# Patient Record
Sex: Female | Born: 1935 | Race: White | Hispanic: No | Marital: Married | Smoking: Former smoker
Health system: Southern US, Community
[De-identification: ages and names within clinical notes are randomized; demographics above are authoritative.]

## PROBLEM LIST (undated history)

## (undated) DIAGNOSIS — F329 Major depressive disorder, single episode, unspecified: Secondary | ICD-10-CM

## (undated) DIAGNOSIS — E079 Disorder of thyroid, unspecified: Secondary | ICD-10-CM

## (undated) DIAGNOSIS — F419 Anxiety disorder, unspecified: Secondary | ICD-10-CM

## (undated) DIAGNOSIS — F32A Depression, unspecified: Secondary | ICD-10-CM

## (undated) DIAGNOSIS — I10 Essential (primary) hypertension: Secondary | ICD-10-CM

## (undated) DIAGNOSIS — E785 Hyperlipidemia, unspecified: Secondary | ICD-10-CM

## (undated) HISTORY — DX: Essential (primary) hypertension: I10

## (undated) HISTORY — DX: Hyperlipidemia, unspecified: E78.5

## (undated) HISTORY — DX: Disorder of thyroid, unspecified: E07.9

## (undated) HISTORY — DX: Major depressive disorder, single episode, unspecified: F32.9

## (undated) HISTORY — PX: VAGINAL HYSTERECTOMY: SUR661

## (undated) HISTORY — DX: Anxiety disorder, unspecified: F41.9

## (undated) HISTORY — PX: TONSILLECTOMY: SHX5217

## (undated) HISTORY — DX: Depression, unspecified: F32.A

---

## 2004-09-27 ENCOUNTER — Ambulatory Visit: Payer: Self-pay | Admitting: Internal Medicine

## 2004-10-12 ENCOUNTER — Ambulatory Visit: Payer: Self-pay | Admitting: Internal Medicine

## 2005-10-07 ENCOUNTER — Ambulatory Visit: Payer: Self-pay | Admitting: Internal Medicine

## 2006-02-14 ENCOUNTER — Ambulatory Visit: Payer: Self-pay | Admitting: Internal Medicine

## 2006-02-15 ENCOUNTER — Ambulatory Visit: Payer: Self-pay | Admitting: Internal Medicine

## 2006-02-18 ENCOUNTER — Emergency Department: Payer: Self-pay | Admitting: Emergency Medicine

## 2006-02-18 ENCOUNTER — Other Ambulatory Visit: Payer: Self-pay

## 2006-02-18 ENCOUNTER — Ambulatory Visit: Payer: Self-pay | Admitting: Internal Medicine

## 2006-08-31 ENCOUNTER — Ambulatory Visit: Payer: Self-pay | Admitting: Internal Medicine

## 2006-09-13 ENCOUNTER — Ambulatory Visit: Payer: Self-pay | Admitting: Internal Medicine

## 2006-10-09 ENCOUNTER — Ambulatory Visit: Payer: Self-pay | Admitting: Internal Medicine

## 2006-10-10 ENCOUNTER — Ambulatory Visit: Payer: Self-pay | Admitting: Internal Medicine

## 2007-09-18 ENCOUNTER — Ambulatory Visit: Payer: Self-pay | Admitting: Internal Medicine

## 2007-10-12 ENCOUNTER — Ambulatory Visit: Payer: Self-pay | Admitting: Internal Medicine

## 2008-10-15 ENCOUNTER — Ambulatory Visit: Payer: Self-pay | Admitting: Internal Medicine

## 2008-10-23 ENCOUNTER — Ambulatory Visit: Payer: Self-pay | Admitting: Internal Medicine

## 2008-12-17 ENCOUNTER — Ambulatory Visit: Payer: Self-pay | Admitting: Internal Medicine

## 2009-01-21 ENCOUNTER — Ambulatory Visit: Payer: Self-pay | Admitting: Surgery

## 2009-04-29 ENCOUNTER — Ambulatory Visit: Payer: Self-pay | Admitting: Surgery

## 2009-04-30 ENCOUNTER — Ambulatory Visit: Payer: Self-pay | Admitting: Internal Medicine

## 2009-10-06 ENCOUNTER — Ambulatory Visit: Payer: Self-pay | Admitting: Gastroenterology

## 2009-10-09 LAB — PATHOLOGY REPORT

## 2009-10-29 ENCOUNTER — Ambulatory Visit: Payer: Self-pay | Admitting: Internal Medicine

## 2010-04-19 IMAGING — US ULTRASOUND LEFT BREAST
1 series · 17 of 18 positions shown · non-contrast
Comparison: none

REASON FOR EXAM: left breast parenchymal density
COMMENTS:

[Series 1: ultrasound left breast · 17 of 18 slices shown]
[im 1/18]
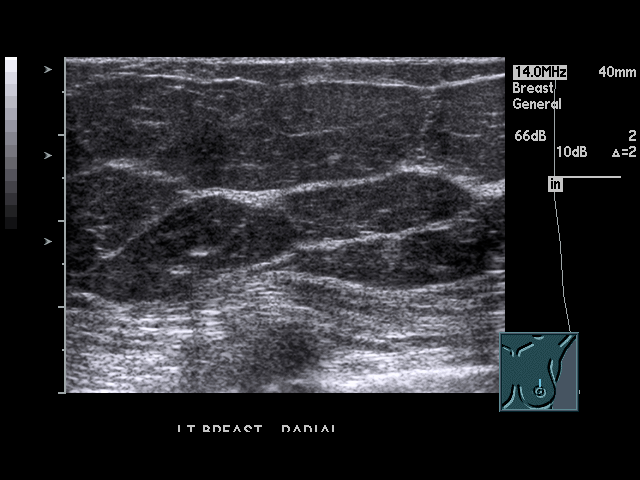
[im 2/18]
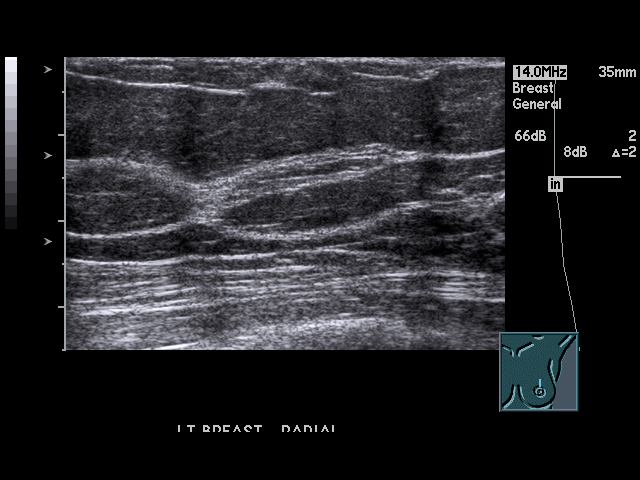
[im 3/18]
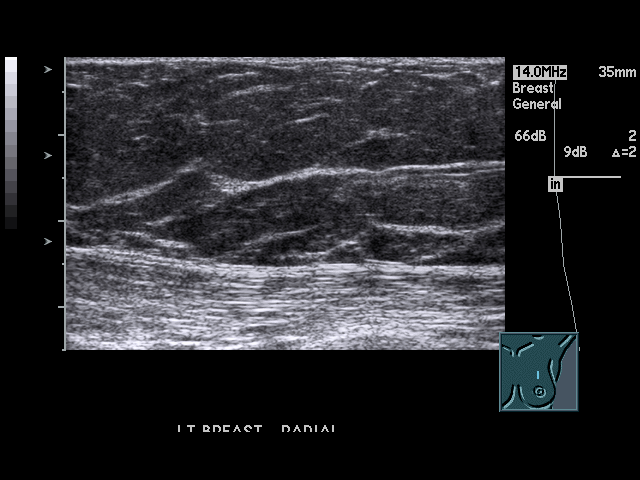
[im 4/18]
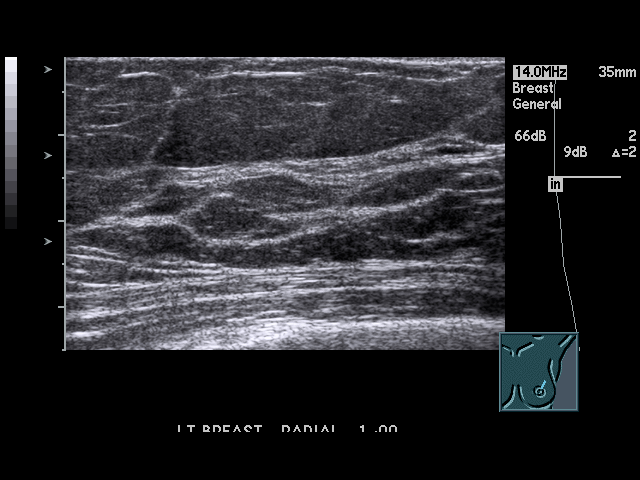
[im 5/18]
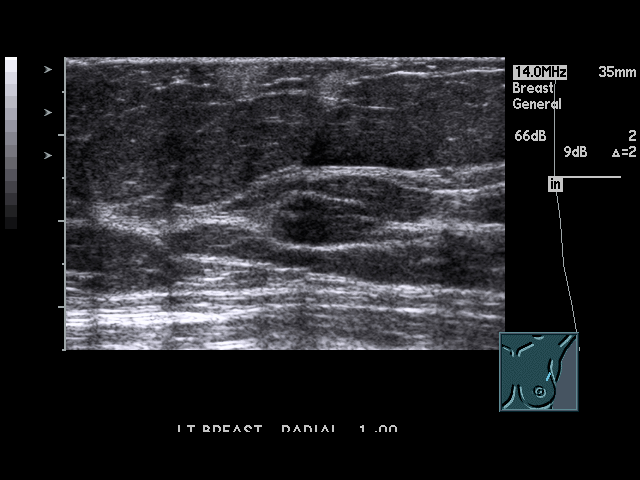
[im 6/18]
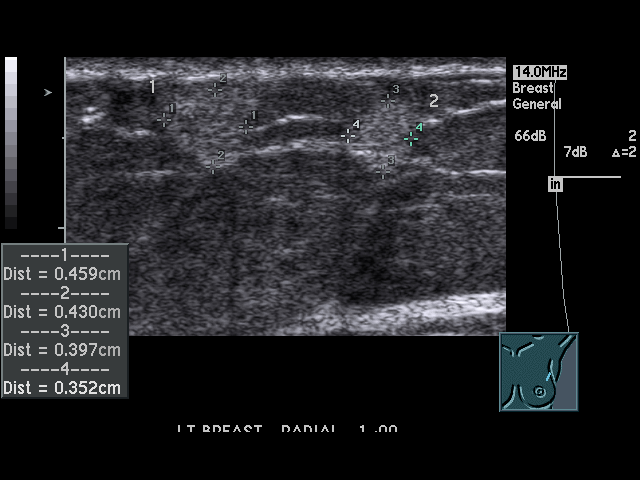
[im 7/18]
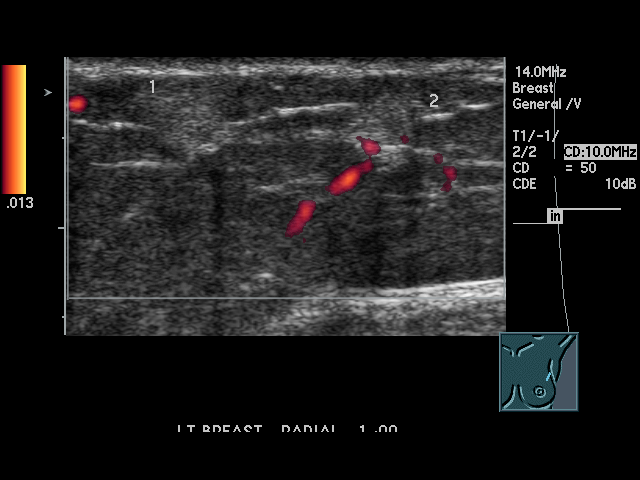
[im 8/18]
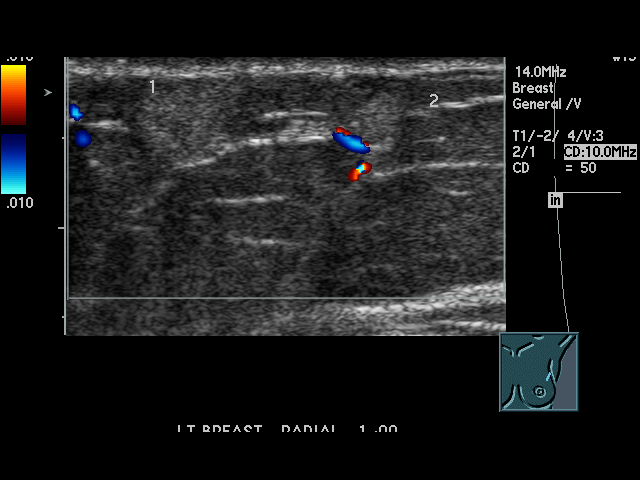
[im 10/18]
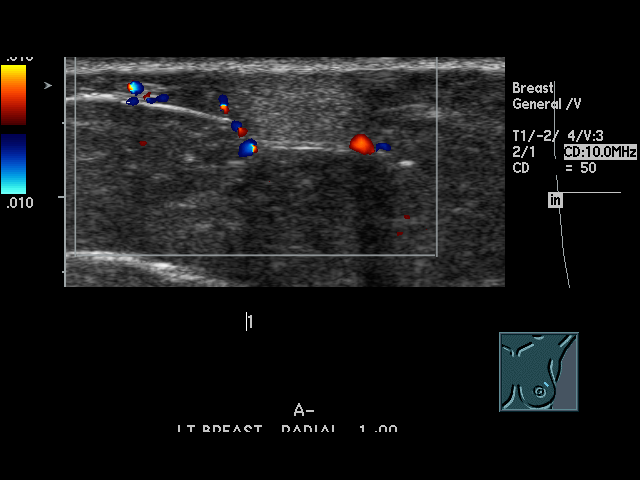
[im 11/18]
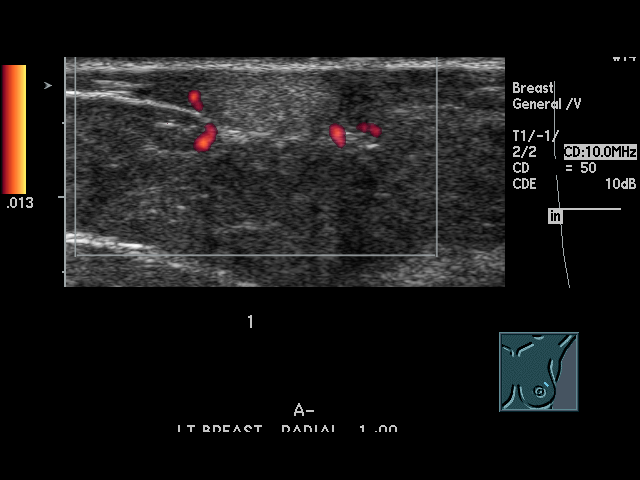
[im 12/18]
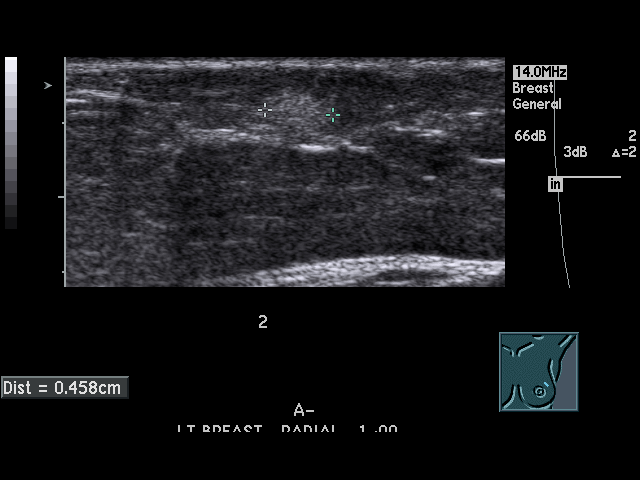
[im 13/18]
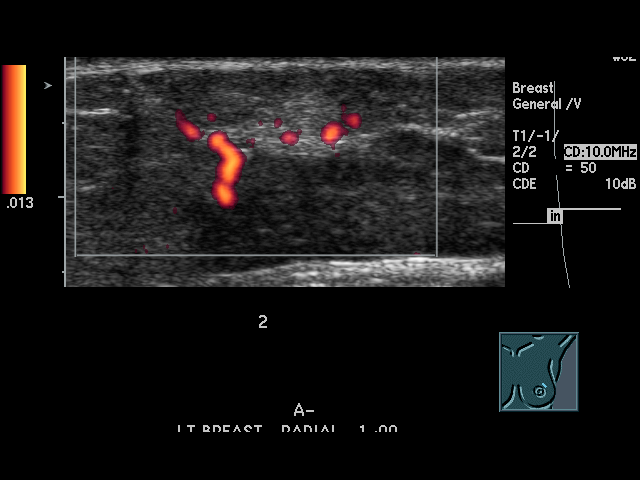
[im 14/18]
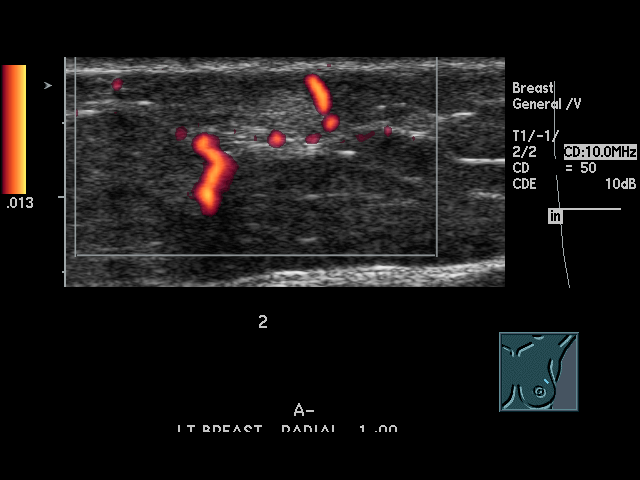
[im 15/18]
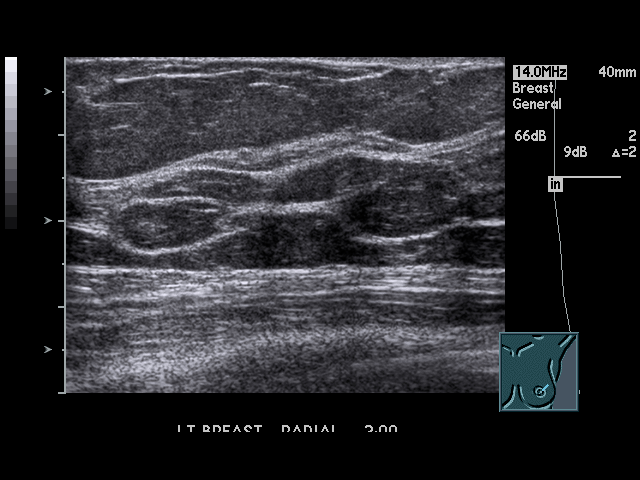
[im 16/18]
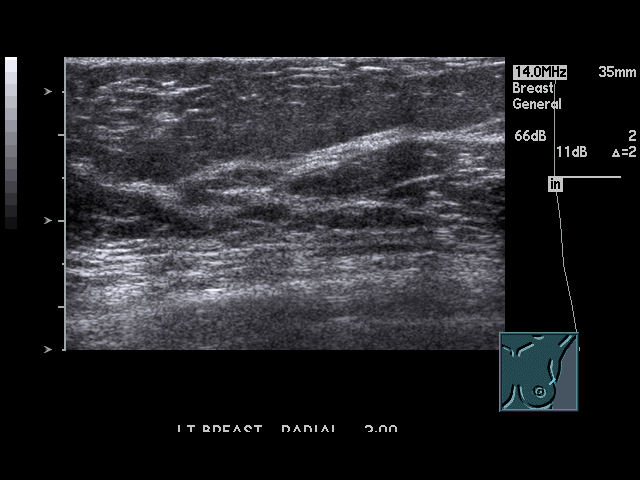
[im 17/18]
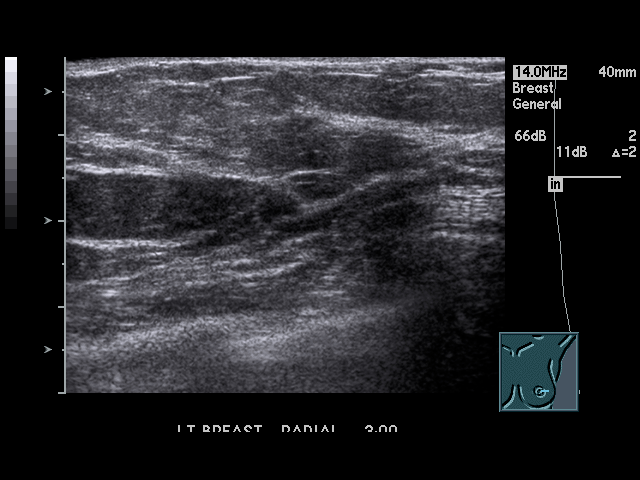
[im 18/18]
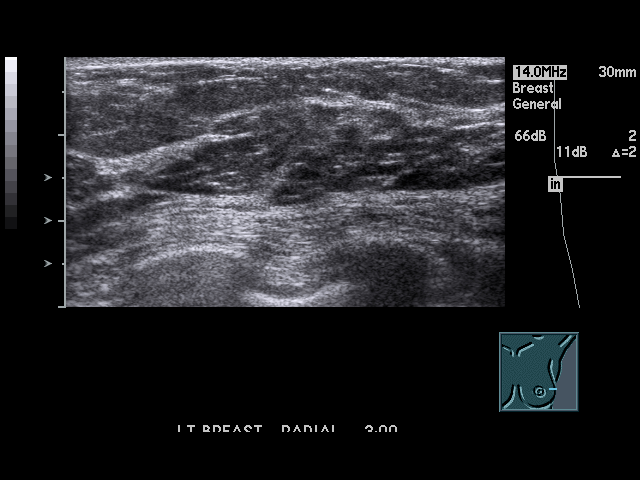

[17 of 18 positions shown; findings below may reference images not displayed]

PROCEDURE:     US  - US BREAST LEFT  - October 23, 2008  [DATE]

RESULT:

RESULT:     Additional views of the left breast reveal no focal underlying
abnormality. Ultrasound was performed for confirmation. Ultrasound reveals
two, well-circumscribed, hyperechoic, less than 1 cm, small lesions
consistent with tiny lipomas. A repeat Left Breast Ultrasound can be
performed in six months as can repeat mammogram to ensure stability.
IMPRESSION: BI-RADS: Category 3 - Probably Benign Finding (interval follow-up).

## 2010-11-30 ENCOUNTER — Ambulatory Visit: Payer: Self-pay | Admitting: Internal Medicine

## 2011-02-25 ENCOUNTER — Ambulatory Visit: Payer: Self-pay | Admitting: Internal Medicine

## 2012-02-28 ENCOUNTER — Ambulatory Visit: Payer: Self-pay | Admitting: Internal Medicine

## 2012-09-25 ENCOUNTER — Ambulatory Visit: Payer: Self-pay | Admitting: Internal Medicine

## 2013-01-28 ENCOUNTER — Ambulatory Visit: Payer: Self-pay | Admitting: Internal Medicine

## 2013-02-21 ENCOUNTER — Ambulatory Visit: Payer: Self-pay | Admitting: Gastroenterology

## 2013-02-28 ENCOUNTER — Ambulatory Visit: Payer: Self-pay | Admitting: Internal Medicine

## 2013-04-02 ENCOUNTER — Ambulatory Visit: Payer: Self-pay | Admitting: Gastroenterology

## 2013-04-04 LAB — PATHOLOGY REPORT

## 2013-04-10 ENCOUNTER — Ambulatory Visit: Payer: Self-pay | Admitting: Gastroenterology

## 2013-04-23 ENCOUNTER — Ambulatory Visit: Payer: Self-pay | Admitting: Gastroenterology

## 2013-07-06 DIAGNOSIS — IMO0001 Reserved for inherently not codable concepts without codable children: Secondary | ICD-10-CM | POA: Insufficient documentation

## 2013-08-27 DIAGNOSIS — E785 Hyperlipidemia, unspecified: Secondary | ICD-10-CM | POA: Insufficient documentation

## 2013-08-27 DIAGNOSIS — M9979 Connective tissue and disc stenosis of intervertebral foramina of abdomen and other regions: Secondary | ICD-10-CM | POA: Insufficient documentation

## 2013-08-27 DIAGNOSIS — F32A Depression, unspecified: Secondary | ICD-10-CM | POA: Insufficient documentation

## 2013-08-27 DIAGNOSIS — I10 Essential (primary) hypertension: Secondary | ICD-10-CM | POA: Insufficient documentation

## 2013-08-27 DIAGNOSIS — F419 Anxiety disorder, unspecified: Secondary | ICD-10-CM

## 2013-08-27 DIAGNOSIS — R251 Tremor, unspecified: Secondary | ICD-10-CM | POA: Insufficient documentation

## 2013-08-27 DIAGNOSIS — F329 Major depressive disorder, single episode, unspecified: Secondary | ICD-10-CM | POA: Insufficient documentation

## 2013-08-27 DIAGNOSIS — G4700 Insomnia, unspecified: Secondary | ICD-10-CM | POA: Insufficient documentation

## 2013-08-27 DIAGNOSIS — E039 Hypothyroidism, unspecified: Secondary | ICD-10-CM | POA: Insufficient documentation

## 2013-09-18 DIAGNOSIS — H02409 Unspecified ptosis of unspecified eyelid: Secondary | ICD-10-CM | POA: Insufficient documentation

## 2013-09-18 DIAGNOSIS — H519 Unspecified disorder of binocular movement: Secondary | ICD-10-CM | POA: Insufficient documentation

## 2013-12-02 DIAGNOSIS — R5383 Other fatigue: Secondary | ICD-10-CM | POA: Insufficient documentation

## 2014-03-03 ENCOUNTER — Ambulatory Visit: Payer: Self-pay | Admitting: Internal Medicine

## 2014-09-04 ENCOUNTER — Ambulatory Visit (INDEPENDENT_AMBULATORY_CARE_PROVIDER_SITE_OTHER): Payer: 59 | Admitting: Psychiatry

## 2014-09-04 ENCOUNTER — Encounter: Payer: Self-pay | Admitting: Psychiatry

## 2014-09-04 VITALS — BP 177/103 | HR 65

## 2014-09-04 DIAGNOSIS — F329 Major depressive disorder, single episode, unspecified: Secondary | ICD-10-CM

## 2014-09-04 DIAGNOSIS — M810 Age-related osteoporosis without current pathological fracture: Secondary | ICD-10-CM | POA: Insufficient documentation

## 2014-09-04 DIAGNOSIS — M1990 Unspecified osteoarthritis, unspecified site: Secondary | ICD-10-CM | POA: Insufficient documentation

## 2014-09-04 DIAGNOSIS — F411 Generalized anxiety disorder: Secondary | ICD-10-CM

## 2014-09-04 DIAGNOSIS — E079 Disorder of thyroid, unspecified: Secondary | ICD-10-CM | POA: Insufficient documentation

## 2014-09-04 DIAGNOSIS — H409 Unspecified glaucoma: Secondary | ICD-10-CM | POA: Insufficient documentation

## 2014-09-04 DIAGNOSIS — C6930 Malignant neoplasm of unspecified choroid: Secondary | ICD-10-CM | POA: Insufficient documentation

## 2014-09-04 DIAGNOSIS — L409 Psoriasis, unspecified: Secondary | ICD-10-CM | POA: Insufficient documentation

## 2014-09-04 DIAGNOSIS — F32A Depression, unspecified: Secondary | ICD-10-CM

## 2014-09-04 MED ORDER — VENLAFAXINE HCL ER 150 MG PO CP24: 150.0000 mg | ORAL_CAPSULE | Freq: Every day | ORAL | Status: DC

## 2014-09-04 NOTE — Progress Notes (Signed)
BH MD/PA/NP OP Progress Note  09/04/2014 10:42 AM Sara Vaughn  MRN:  [id number]  Subjective:    Patient is a 81 year old female who presented for the follow-up appointment. She has history of depression and reported that she feels anxious most of the time. Her blood pressure was significantly elevated and it was checked twice during this interview. She reported that she feels apprehensive. She was pleasant and cooperative during the interview she does not have any source of significant stress. Patient reported that she has been compliant with her medication and has stopped the BuSpar and does not feel as sleepy as in the past. She currently denied having any suicidal ideations or plans. She reported that she has history of strokes in her family members and she became concerned about her high blood pressure during this visit she is planning to visit her primary care physician right now with her husband.   Chief Complaint:  Chief Complaint    Follow-up; Anxiety; Depression     Visit Diagnosis:     ICD-9-CM ICD-10-CM   1. Depression 311 F32.9   2. GAD (generalized anxiety disorder) 300.02 F41.1     Past Medical History: No past medical history on file. No past surgical history on file. Family History: No family history on file. Social History:  History   Social History  . Marital Status: Married    Spouse Name: N/A  . Number of Children: N/A  . Years of Education: N/A   Social History Main Topics  . Smoking status: Not on file  . Smokeless tobacco: Not on file  . Alcohol Use: Not on file  . Drug Use: Not on file  . Sexual Activity: Not on file   Other Topics Concern  . Not on file   Social History Narrative  . No narrative on file   Additional History:   Lives with husband, married x 53 years.   Assessment:   Musculoskeletal: Strength & Muscle Tone: within normal limits Gait & Station: normal Patient leans: N/A  Psychiatric Specialty Exam: HPI  Review of  Systems  Constitutional: Positive for malaise/fatigue. Negative for chills.  HENT: Negative for hearing loss and nosebleeds.   Eyes: Positive for double vision and pain.  Respiratory: Negative for stridor.   Cardiovascular: Negative for claudication.  Gastrointestinal: Negative for diarrhea.  Genitourinary: Negative for frequency.  Musculoskeletal: Negative for back pain.  Neurological: Positive for tremors. Negative for sensory change.  Endo/Heme/Allergies: Negative for environmental allergies.  Psychiatric/Behavioral: Negative for depression.    There were no vitals taken for this visit.There is no height or weight on file to calculate BMI.  General Appearance: Fairly Groomed  Eye Contact:  Fair  Speech:  Normal Rate  Volume:  Normal  Mood:  Anxious  Affect:  Congruent  Thought Process:  Coherent  Orientation:  Full (Time, Place, and Person)  Thought Content:  WDL  Suicidal Thoughts:  No  Homicidal Thoughts:  No  Memory:  Immediate;   Fair  Judgement:  Fair  Insight:  Fair  Psychomotor Activity:  Decreased  Concentration:  Fair  Recall:  [employer] of Knowledge: Fair  Language: Fair  Akathisia:  No  Handed:  Right  AIMS (if indicated):  none  Assets:  Communication Skills Desire for Improvement Housing  ADL's:  Intact  Cognition: WNL  Sleep:  7-8    Is the patient at risk to self?  No. Has the patient been a risk to self in the past 6  months?  No. Has the patient been a risk to self within the distant past?  No. Is the patient a risk to others?  No. Has the patient been a risk to others in the past 6 months?  No. Has the patient been a risk to others within the distant past?  No.  Current Medications: Current Outpatient Prescriptions  Medication Sig Dispense Refill  . busPIRone (BUSPAR) 5 MG tablet Take by mouth.    . hydrochlorothiazide (HYDRODIURIL) 25 MG tablet TAKE 1 TABLET EVERY DAY    . levothyroxine (SYNTHROID) 100 MCG tablet TAKE 1 TABLET EVERY DAY     . losartan (COZAAR) 50 MG tablet TAKE 1 TABLET EVERY DAY    . RABEprazole (ACIPHEX) 20 MG tablet TAKE 1 TABLET EVERY DAY USUALLY 30 MINUTES BEFORE BREAKFAST    . simvastatin (ZOCOR) 20 MG tablet TAKE 1 TABLET EVERY DAY USUALLY IN THE EVENING    . timolol (TIMOPTIC) 0.5 % ophthalmic solution     . venlafaxine XR (EFFEXOR-XR) 75 MG 24 hr capsule Take by mouth.    . fluticasone (FLONASE) 50 MCG/ACT nasal spray Place into the nose.    . latanoprost (XALATAN) 0.005 % ophthalmic solution Apply to eye.     No current facility-administered medications for this visit.    Medical Decision Making:  Established Problem, Stable/Improving (1), Review of Last Therapy Session (1) and Review of Medication Regimen & Side Effects (2)  Treatment Plan Summary:Medication management   Advised patient to go to her primary care physician from this office as her blood pressure is significantly elevated and it was checked twice. She agreed with the plan. She will continue on her venlafaxine as prescribed Patient will follow up in 1 month or earlier depending on her symptoms. She will also start following up with Miguel Dibble for her therapy sessions Advised patient to call if she notices worsening of her symptoms and she demonstrated understanding   More than 50% of the time spent in psychoeducation, counseling and coordination of care.    This note was generated in part or whole with voice recognition software. Voice regonition is usually quite accurate but there are transcription errors that can and very often do occur. I apologize for any typographical errors that were not detected and corrected.    Rainey Pines 09/04/2014, 10:42 AM

## 2014-09-11 ENCOUNTER — Ambulatory Visit (INDEPENDENT_AMBULATORY_CARE_PROVIDER_SITE_OTHER): Payer: 59 | Admitting: Licensed Clinical Social Worker

## 2014-09-11 DIAGNOSIS — F411 Generalized anxiety disorder: Secondary | ICD-10-CM | POA: Diagnosis not present

## 2014-09-11 DIAGNOSIS — F331 Major depressive disorder, recurrent, moderate: Secondary | ICD-10-CM

## 2014-09-11 NOTE — Progress Notes (Signed)
THERAPIST PROGRESS NOTE  Session Time: 1:15 p.m. - 2:20 p.m.  Participation Level: Active  Behavioral Response: Casual and Well GroomedAlertAnxious and Depressed  Type of Therapy: Individual Therapy  Treatment Goals addressed: Anxiety and Coping  Interventions: CBT, Solution Focused, Strength-based, Supportive and Reframing  Summary: PEGAH SEGEL is a 81 y.o. female who presents with primary diagnosis of Major Depression and additional symptoms of anxiety and who also sees Dr. Gretel Acre in this clinic for medication management.  Mrs. Henslee has not been to see LCSW for several months.  She recently had a visit from her daughter who was down from [place].  "She came down to do something with mom."  Client proudly showed off a new outfit that her daughter bought her during recent visit.  She admitted that she did not have the interest or energy to go with her daughter and therefore the daughter choose the outfits for her.  "I've gained so much weight that I can't zip my pants."  Her adult children have been visiting and calling and sending cards and this has lifted up her mood.  Her husband, Ronalee Belts, is supportive and wanting to take her [facility] for additional medical testing.  He has been more helpful around the home per client.  She spoke briefly of various challenges over the years due to spouse's own mental health symptoms/behaviors.    She admits that she has been sad and that this feeling goes back several years even when she was working.  The symptoms that she voiced having primary concern about are around her loss of energy and interests.  There is a sense of "why bother" doing anything because she lacks the energy to participate in most things.  Madesyn talked about various life changes and decisions that she and spouse and their adult children have been speaking about.  These include putting their beach home on the market to be sold, spouse telling one of the daughters who has been financially  dependent on them for years that she was no longer going to receive money after a certain month this year and conversations around selling their home and moving to [place] to be near their son, his wife and their grand-children.  "I love my house and don't want to leave it.  It is more than we need."  There is some hesitancy on client's part to move to [place], not because of her relationship with family, rather concerns that her spouse will try to "take over" given that he likes to be in charge of things per client.  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  "My card group, they're all dying. I've been with them for 45 years."  She spoke somewhat tangentially yet no evidence of confusion, paranoia or disorientation.  Some disappointment voiced in terms of what she is not getting from her PCP which she informed LCSW as "I need him to tell me what to do. To give me some direction about what I should be doing with myself."  She reported less fatigue since stopping anti-anxiety medication.  Client would like to consider what are realistic goals for her at this stage of her life. 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  "I was always prepared."  She was receptive to information and suggestions provided by LCSW during session today and agreed  to read information for discussion at next session.  Alizia with voiced interest in mindfulness.  Suicidal/Homicidal: Negativewithout intent/plan  Therapist Response:   Assist in developing awareness of cognitive messages that reinforce hopelessness and helplessness and assist in self recognition of healthier cognitive messages that enhance self-confidence and improve coping strategies.  LCSW provided client with supportive therapy with insight along with emotional and social support to reinforce  client's strengths and available resources.  Discussed possibility that client is experiencing what is referred to as a Phase of Life problem.  Offered her information on Cindee Salt Erickson's developmental theory on integrity versus despair.  Also provided her with hand-outs on  Understanding and coping with anxiety and the title of a book about Meditation and Mindfulness.  Offered emotional support and validation of current struggles while reassuring that change is possible.  Additional information provided on increasing motivation when depressed.  Plan: Return again in 1-2 weeks.  LCSW will explore and process with client more specific therapeutic goals to assist her to improve symptoms of anxiety and depression.  Diagnosis: Major Depressive Disorder, Recurrent, Moderate   Generalized Anxiety Disorder   Phase of Life Problem   Miguel Dibble, LCSW 09/11/2014

## 2014-09-12 DIAGNOSIS — F331 Major depressive disorder, recurrent, moderate: Secondary | ICD-10-CM | POA: Insufficient documentation

## 2014-09-12 DIAGNOSIS — F411 Generalized anxiety disorder: Secondary | ICD-10-CM | POA: Insufficient documentation

## 2014-10-02 ENCOUNTER — Ambulatory Visit (INDEPENDENT_AMBULATORY_CARE_PROVIDER_SITE_OTHER): Payer: 59 | Admitting: Licensed Clinical Social Worker

## 2014-10-02 DIAGNOSIS — F330 Major depressive disorder, recurrent, mild: Secondary | ICD-10-CM | POA: Diagnosis not present

## 2014-10-02 DIAGNOSIS — F411 Generalized anxiety disorder: Secondary | ICD-10-CM

## 2014-10-02 NOTE — Progress Notes (Signed)
THERAPIST PROGRESS NOTE  Session Time: 1:15 p.m. - 2:18 p.m.  Participation Level: Active  Behavioral Response: Well GroomedAlertAnxious  Type of Therapy: Individual Therapy  Treatment Goals addressed: Coping  Interventions: CBT, Strength-based, Supportive and Reframing  Summary: Sara Vaughn is a 81 y.o. female who presents with ongoing symptoms of anxiety which appear to be relatively life long from her report and depressive symptoms that appear to be improving as evidenced by her statement "I'm feeling more hopeful."  In addition, Sara Vaughn reports  "I'm not as groggy." Client showed LCSW a big bruise and knot on her left upper arm.  She fell after losing her balance in her bathroom.  Recently enjoyed a trip to their beach house on the 4th of July with her husband, son & two grandsons.  Reasons for dread around how spouse's driving scared.  "I don't feel like it's depression. I think it's something physical with me."  "I'm feeling like I'm old but I'm not but I'm moving like it." "Yesterday I was feeling really good." She has been more social and stated "It's because I feel like doing more. I feel like I've been in a better mood. There isn't the fatigue."    Discussed that in some ways she believes that her moods are better because her spouse, Sara Vaughn, is stable on his medications for Bi-Polar.  Valla was able to identify a connection between her moods and outlook and her spouses.  She talked more about family of origin issues during this session, specifically that her father was loud and would yell and having husband do the same over the years appears to have kept client very anxious.  She voiced interest in finding ways to be more calm and with the ability to recognize that through challenging certain patterns of thinking and reacting, that she can be less anxious.  Faron continues to be receptive to the therapeutic process and has fair to good insight about what role the  information discussed during session can have on her overall quality of life and goal of being more calm.  She voiced understanding of the physiological and emotional/behavioral connections of stress, worry and chronic anxiety.  "That's what my daughter in law told me. That I had just run myself down."  Goal:  "To be calm."  Suicidal/Homicidal: Negativewithout intent/plan  Therapist Response:  Ongoing active and reflective listening to build and maintain trust and rapport with client.  Provided psycho-education around impacts of prolonged anxiety and the physiology of fear, real and imagined.  Provided additional hand-outs on examples of irrational and rational beliefs and strategies for identifying and re-framing negative and irrational thoughts.  LCSW provided client with supportive therapy with insight along with emotional and social support to reinforce client's strengths and available resources.  Offered psycho-education on additional factors at work in depressive and anxious symptoms including feeling ill prepared to deal with high conflict individuals, co-dependency and passivity with lack of boundaries.  Reassured client that she can reach personal goal through learning of key concepts/theories and application.  Encouraged to read all hand-outs for discussion at next session and welcomed calls between sessions PRN.  Plan: Return again in 3 weeks.  Sara Vaughn will read information provided on cognitive distortions and the CBT model as well as about being the care-giver for someone with Bi-Polar and hand-out about co-dependency features and how to improve self-care and boundary setting.  Diagnosis: Major Depressive Disorder, Recurrent, Mild    PHQ9 score today: 6, Somewhat difficult  Generalized Anxiety Disorder   Phase of Life Problem   Sara Dibble, LCSW 10/02/2014

## 2014-10-13 ENCOUNTER — Ambulatory Visit (INDEPENDENT_AMBULATORY_CARE_PROVIDER_SITE_OTHER): Payer: 59 | Admitting: Psychiatry

## 2014-10-13 ENCOUNTER — Encounter: Payer: Self-pay | Admitting: Psychiatry

## 2014-10-13 VITALS — BP 124/82 | HR 70 | Temp 97.8°F | Ht 62.0 in | Wt 163.2 lb

## 2014-10-13 DIAGNOSIS — Z85828 Personal history of other malignant neoplasm of skin: Secondary | ICD-10-CM | POA: Insufficient documentation

## 2014-10-13 DIAGNOSIS — Z8679 Personal history of other diseases of the circulatory system: Secondary | ICD-10-CM | POA: Insufficient documentation

## 2014-10-13 DIAGNOSIS — F411 Generalized anxiety disorder: Secondary | ICD-10-CM | POA: Insufficient documentation

## 2014-10-13 DIAGNOSIS — Z8639 Personal history of other endocrine, nutritional and metabolic disease: Secondary | ICD-10-CM | POA: Insufficient documentation

## 2014-10-13 DIAGNOSIS — F331 Major depressive disorder, recurrent, moderate: Secondary | ICD-10-CM

## 2014-10-13 DIAGNOSIS — Z8719 Personal history of other diseases of the digestive system: Secondary | ICD-10-CM | POA: Insufficient documentation

## 2014-10-13 DIAGNOSIS — Z8709 Personal history of other diseases of the respiratory system: Secondary | ICD-10-CM | POA: Insufficient documentation

## 2014-10-13 MED ORDER — VENLAFAXINE HCL ER 150 MG PO CP24: 150.0000 mg | ORAL_CAPSULE | Freq: Every day | ORAL | Status: DC

## 2014-10-13 NOTE — Progress Notes (Signed)
BH MD/PA/NP OP Progress Note  10/13/2014 2:43 PM Sara Vaughn  MRN:  [id number]  Subjective:   Patient is a 81 year old female who presented for the follow-up appointment. She reported that she has been stable on her medications. She reported that she fell approximately a month ago as she tripped on the dresser and has been feeling better. She has been taking Effexor  in the morning and has stopped taking the BuSpar. She has been sleeping well. She appeared pleasant and cooperative during the interview and was talking about the world events. She denied having any suicidal homicidal ideations or plans. Chief Complaint:  Chief Complaint    Follow-up; Anxiety; Depression; Medication Refill     Visit Diagnosis:  No diagnosis found.  Past Medical History:  Past Medical History  Diagnosis Date  . Anxiety   . Thyroid disease   . Depression   . Hypertension   . Hyperlipidemia     Past Surgical History  Procedure Laterality Date  . Vaginal hysterectomy    . Tonsillectomy     Family History:  Family History  Problem Relation Age of Onset  . Alcohol abuse Father   . Anxiety disorder Father   . Hypertension Mother   . Hyperlipidemia Mother   . Heart attack Mother   . Transient ischemic attack Mother   . Hypertension Brother   . Diabetes Brother    Social History:  History   Social History  . Marital Status: Married    Spouse Name: N/A  . Number of Children: N/A  . Years of Education: N/A   Social History Main Topics  . Smoking status: Former Smoker    Types: Cigarettes    Quit date: 10/13/1974  . Smokeless tobacco: Never Used  . Alcohol Use: No  . Drug Use: No  . Sexual Activity: No   Other Topics Concern  . None   Social History Narrative   Additional History:  Lives with husband and he has been very attentive to her. She stated that she spends time in a very organized fashion.   Assessment:   Musculoskeletal: Strength & Muscle Tone: within normal  limits Gait & Station: normal Patient leans: N/A  Psychiatric Specialty Exam: HPI  Review of Systems  Cardiovascular: Positive for claudication.  Psychiatric/Behavioral: Positive for depression. The patient is nervous/anxious. The patient does not have insomnia.   All other systems reviewed and are negative.   Blood pressure 124/82, pulse 70, temperature 97.8 F (36.6 C), temperature source Tympanic, height 5\' 2"  (1.575 m), weight 163 lb 3.2 oz (74.027 kg), SpO2 94 %.Body mass index is 29.84 kg/(m^2).  General Appearance: Casual  Eye Contact:  Fair  Speech:  Clear and Coherent  Volume:  Normal  Mood:  Anxious and Depressed  Affect:  Congruent  Thought Process:  Coherent  Orientation:  Full (Time, Place, and Person)  Thought Content:  WDL  Suicidal Thoughts:  No  Homicidal Thoughts:  No  Memory:  Immediate;   Fair  Judgement:  Fair  Insight:  Fair  Psychomotor Activity:  Normal  Concentration:  Fair  Recall:  [employer] of Knowledge: Fair  Language: Fair  Akathisia:  No  Handed:  Right  AIMS (if indicated):  None   Assets:  Communication Skills Desire for Improvement Social Support  ADL's:  Intact  Cognition: WNL  Sleep:     Is the patient at risk to self?  No. Has the patient been a risk to self in the  past 6 months?  No. Has the patient been a risk to self within the distant past?  No. Is the patient a risk to others?  No. Has the patient been a risk to others in the past 6 months?  No. Has the patient been a risk to others within the distant past?  No.  Current Medications: Current Outpatient Prescriptions  Medication Sig Dispense Refill  . fluticasone (FLONASE) 50 MCG/ACT nasal spray Place into the nose.    . hydrochlorothiazide (HYDRODIURIL) 25 MG tablet TAKE 1 TABLET EVERY DAY    . latanoprost (XALATAN) 0.005 % ophthalmic solution Apply to eye.    . levothyroxine (SYNTHROID) 100 MCG tablet TAKE 1 TABLET EVERY DAY    . losartan (COZAAR) 50 MG tablet TAKE 1  TABLET EVERY DAY    . RABEprazole (ACIPHEX) 20 MG tablet TAKE 1 TABLET EVERY DAY USUALLY 30 MINUTES BEFORE BREAKFAST    . simvastatin (ZOCOR) 20 MG tablet TAKE 1 TABLET EVERY DAY USUALLY IN THE EVENING    . timolol (TIMOPTIC) 0.5 % ophthalmic solution     . venlafaxine XR (EFFEXOR-XR) 150 MG 24 hr capsule Take 1 capsule (150 mg total) by mouth daily with breakfast. 30 capsule 2  . busPIRone (BUSPAR) 5 MG tablet Take by mouth.     No current facility-administered medications for this visit.    Medical Decision Making:  Established Problem, Stable/Improving (1) and Review of Psycho-Social Stressors (1)  Treatment Plan Summary:Medication management   Patient will continue on Effexor XR 150 mg by mouth daily. Medication refill for the next 2 months and she will follow-up in 6 weeks or earlier depending on her symptoms.   More than 50% of the time spent in psychoeducation, counseling and coordination of care.    This note was generated in part or whole with voice recognition software. Voice regonition is usually quite accurate but there are transcription errors that can and very often do occur. I apologize for any typographical errors that were not detected and corrected.      Rainey Pines 10/13/2014, 2:43 PM

## 2014-10-23 ENCOUNTER — Ambulatory Visit (INDEPENDENT_AMBULATORY_CARE_PROVIDER_SITE_OTHER): Payer: 59 | Admitting: Licensed Clinical Social Worker

## 2014-10-23 DIAGNOSIS — F331 Major depressive disorder, recurrent, moderate: Secondary | ICD-10-CM

## 2014-10-23 DIAGNOSIS — F411 Generalized anxiety disorder: Secondary | ICD-10-CM

## 2014-10-23 NOTE — Progress Notes (Signed)
THERAPIST PROGRESS NOTE  Session Time:  2:25 p.m. - 3:20 p.m.  Participation Level: Active  Behavioral Response: Neat and Well GroomedAlertAnxious and Depressed  Type of Therapy: Individual Therapy  Treatment Goals addressed: Anxiety, Communication: assertiveness with family and Coping  Interventions: CBT, Solution Focused, Strength-based, Assertiveness Training, Supportive and Reframing  Summary: Sara Vaughn is a 81 y.o. female who presents with ongoing chronic worry and anxiety, depressive symptoms and low energy.  During session she identified that at this time she is likely worried about at least two specific situations. One concern is that she and spouse's oldest daughter, Sara Vaughn, will visit soon for a long week-end.  Their financial support to her is winding down since the daughter is looking to begin a new job that may likely help her towards payment of her student loans.  "As far as me, it's about my worrying about Sara Vaughn getting here and back home safely and Sara Vaughn and her husband who are going to [place] to do a spiritual walk."  Client expressed humor about adult children and grand-children just needing to not go anywhere so she knows where they are and how they are.  Sara Vaughn continues to be worried about her health especially her ongoing low energy.  "Is it because of my age? That's what Dr. Gretel Acre tells me." She thought she was feeling better in terms of her energy and improvement with symptoms of anxiety yet reort has returned over the past week.  "I don't know, maybe I'm worried about Sara Vaughn coming."  Other financial matters involving client and her husband assisting with private school tuition for at least one grand-child along with the daughter in [place] belief that they "should" help has been another ongoing source of stress.  She and LCSW explored historically her experience with chronic worrying and there is some belief per client that "I think because of how my dad was."   There were verbal outbursts of anger in the home and client experienced fear of her father as a result of these.  We also discussed how going from her often chaotic home environment to married life with a spouse with Bi-Polar may have continued to contribute to her excess worrying behaviors. Sara Vaughn denied present conflict with spouse and indicated to LCSW that She discussed how she has always felt that she accomplished a lot in her life and feeling uncertain about how to handle how she is feeling, especially her lack of interests in previously enjoyed social activities and going out.  Additional concern mentioned by client around "Am I being petty? Is this petty?"  This due to her belief that she does not really have problems that would require her to talk to a therapist.  Sara Vaughn did indicate "I really enjoy talking to you though and I seem to feel much better afterwards."  She voiced understanding and agreement with LCSW about the impact of her symptoms on her quality of life and seemed less critical of herself after this discussion.  She remains engaged and motivated to read educational materials provided along with application of the information to enhance coping.  Suicidal/Homicidal: Negativewithout intent/plan   Therapist Response:  Ongoing active and reflective listening to build and maintain trust and rapport with client.  Provided psycho-education around impacts of prolonged anxiety and chronic worry on the physiology of the brain and reviewed how CBT can assist to re-wire rigid and unhealthy thinking styles that contribute to an anxious brain.  Encouraged to continue her work on mindfulness and  also introduced her to the concepts of self-compassion and being aware of how self-criticism negatively affects how she may see herself in relation to how she handles stress and conflict.  Psycho-education on learned behaviors such as fear and hopelessness and helplessness. Encouraged to read hand-outs for  discussion at next session and welcomed calls between sessions PRN.  Gently reinforced that her issues and time in therapy is not petty as these are causing real distress for her.  Validated thoughts and feeling voiced and reinforced the importance of talking through negative emotions while also identifying positives in her life to achieve balance in her emotional well-being.  Plan: Return again in 3 weeks.  Sara Vaughn will read information provided on cognitive distortions and the CBT model as well as about being the care-giver for someone with Bi-Polar and hand-out about co-dependency features and how to improve self-care and boundary setting.  Diagnosis: Major Depressive Disorder, Recurrent, Mild    PHQ9 score today: 6, Somewhat difficult   Generalized Anxiety Disorder   Phase of Life Problem  Miguel Dibble, LCSW 10/23/2014

## 2014-10-24 DIAGNOSIS — F331 Major depressive disorder, recurrent, moderate: Secondary | ICD-10-CM | POA: Insufficient documentation

## 2014-11-13 ENCOUNTER — Ambulatory Visit (INDEPENDENT_AMBULATORY_CARE_PROVIDER_SITE_OTHER): Payer: 59 | Admitting: Licensed Clinical Social Worker

## 2014-11-14 ENCOUNTER — Ambulatory Visit (INDEPENDENT_AMBULATORY_CARE_PROVIDER_SITE_OTHER): Payer: 59 | Admitting: Licensed Clinical Social Worker

## 2014-11-14 DIAGNOSIS — F411 Generalized anxiety disorder: Secondary | ICD-10-CM | POA: Diagnosis not present

## 2014-11-14 DIAGNOSIS — F331 Major depressive disorder, recurrent, moderate: Secondary | ICD-10-CM

## 2014-11-14 NOTE — Progress Notes (Signed)
THERAPIST PROGRESS NOTE  Session Time:  2:10 p.m. -  3:20 p.m.  Participation Level: Active  Behavioral Response: CasualAlertAngry, Depressed and Euthymic  Type of Therapy: Individual Therapy  Treatment Goals addressed: Anxiety and Coping  Interventions: Solution Focused, Strength-based, Supportive and Family Systems  Summary: Sara Vaughn is a 81 y.o. female who presents with ongoing anxiety that she experiences as feeling easily over-whelmed by several things, phase of life transition (aging) and mild to moderate depressive symptoms.  She continues to maintain and apparently her adult children concur, that client is experiencing something medically wrong with her.  On a positive note and since discontinuing the Buspar, she no longer experiences as much fatigue. Client talked about ongoing concern about how little energy she has and that the energy usually increases later in the day. Progress reported per client: "My frame of thought is not as hopeless. I'm getting my humor back." Less dread about things. "Most people can't understand this but I like being with myself."  "I need to change my schedule but I don't want to go to bed earlier." Spouse, Sara Vaughn, falls asleep earlier than she does.  She talked about having very detailed dreams that are of a fantasy theme, using a computer and work related along with traveling and being with her daughter and daughter's friends when they were younger.  Recent dream was about a [place] and being in a scene where another language was being used. There is some belief that this is connected to her discontent with some of the church's teachings.  "Lately I'm losing my accuracy about things." She was referring to letting some things go with some things getting lost like bills. Voiced frustration at herself for this. "I'm having a lot of aches and pains that I haven't had before." "My anxiety is bothering me I've been trying to eliminate my  stressors."  Ongoing support from her husband, Sara Vaughn, and adult children was discussed and recent visit from daughter, Sara Vaughn, was pleasant with [name] joking "She has me on a new diet."  Daughter has introduced her to a new diet to focus on reducing inflammation that is believed to be causing her pain and fatigue.  "I like that you help me to break things down and think about different ways of looking at my situation. What you've said makes a lot of sense and is something that I can share with my family."  "I know that I'm at the end of my life and I'm at peace with this. I don't think my children are."  Sara Vaughn voiced belief that her adult children continue to have expectations or visions of "The iconic mother."  Denied dissatisfaction in previous activities and work and with good insight that she is at a phase in her life where she no longer needs to have a schedule that serves other people.  "I am okay with this." No additional concerns voiced.  Client would like to spend future sessions discussing anxiety and causes/triggers.  She agreed to re-read over previously provided hand-outs on anxiety and how to decrease this.  "By this time in my life I think it's so engrained in me."  Goal:  "To be calm."  Suicidal/Homicidal: Negativewithout intent/plan   Therapist Response:  Ongoing active and reflective listening to build and maintain trust and rapport with client.  Provided psycho-education around impacts of prolonged anxiety and the physiology of fear, real and imagined and offered client a hand-out that explains this process.  Offered supportive  therapy with insight around changing family roles and dynamics along with aging and stages of development and life transitions.  Normalized client's current thoughts and experiences. Encouraged to read all hand-outs for discussion at next session and welcomed calls between sessions PRN.  Encouraged ongoing expression of feelings, thoughts and needs. Encouraged her  to also mention her anxiety to Dr. Gretel Acre at her next appointment.  Plan: Return again in 2-3 weeks.  Client will take medications as prescribed and report any side effects or worsening symptoms to Dr. Gretel Acre or her medical provider.    Diagnosis: Major Depressive Disorder, Recurrent, Mild       Generalized Anxiety Disorder   Phase of Life Problem   Miguel Dibble, LCSW 11/14/2014

## 2014-11-25 ENCOUNTER — Ambulatory Visit: Payer: Self-pay | Admitting: Psychiatry

## 2014-11-28 ENCOUNTER — Ambulatory Visit: Payer: Self-pay | Admitting: Licensed Clinical Social Worker

## 2014-12-04 ENCOUNTER — Encounter: Payer: Self-pay | Admitting: Psychiatry

## 2014-12-04 ENCOUNTER — Ambulatory Visit (INDEPENDENT_AMBULATORY_CARE_PROVIDER_SITE_OTHER): Payer: 59 | Admitting: Psychiatry

## 2014-12-04 VITALS — BP 132/80 | HR 67 | Temp 97.2°F | Ht 62.0 in | Wt 161.8 lb

## 2014-12-04 DIAGNOSIS — F331 Major depressive disorder, recurrent, moderate: Secondary | ICD-10-CM | POA: Diagnosis not present

## 2014-12-04 MED ORDER — VENLAFAXINE HCL ER 150 MG PO CP24: 150.0000 mg | ORAL_CAPSULE | Freq: Every day | ORAL | Status: DC

## 2014-12-04 NOTE — Progress Notes (Signed)
[facility] MD/PA/NP OP Progress Note  12/04/2014 11:39 AM Sara Vaughn  MRN:  [id number]  Subjective:   Patient is a 81 year old married  female who presented for the follow-up appointment. She reported that she is feeling tired this morning as it is too early for her to come for the morning appointment. Patient reported that she usually wakes up around 9 AM. She reported that she did not have enough time to get ready. She is still feeling tired and she sleeps later in the night. She spends time by paying  her bills and reading books. She enjoys going to [employer]. Patient reported that she has been compliant with her medication and her daughter has been helping her with the pillbox. She takes venlafaxine in the morning. She appeared to have a small tremor in her mouth which is normal for her. She reported that her blood pressure is normal. She currently denied having any adverse effects of the medication. Reported that her memory is fine and she is able to drive to [employer] and around the neighborhood. She denied having any anxiety or paranoia.     Chief Complaint:  Chief Complaint    Follow-up; Medication Refill     Visit Diagnosis:  No diagnosis found.  Past Medical History:  Past Medical History  Diagnosis Date  . Anxiety   . Thyroid disease   . Depression   . Hypertension   . Hyperlipidemia     Past Surgical History  Procedure Laterality Date  . Vaginal hysterectomy    . Tonsillectomy     Family History:  Family History  Problem Relation Age of Onset  . Alcohol abuse Father   . Anxiety disorder Father   . Hypertension Mother   . Hyperlipidemia Mother   . Heart attack Mother   . Transient ischemic attack Mother   . Hypertension Brother   . Diabetes Brother    Social History:  Social History   Social History  . Marital Status: Married    Spouse Name: N/A  . Number of Children: N/A  . Years of Education: N/A   Social History Main Topics  . Smoking status:  Former Smoker    Types: Cigarettes    Quit date: 10/13/1974  . Smokeless tobacco: Never Used  . Alcohol Use: No  . Drug Use: No  . Sexual Activity: No   Other Topics Concern  . None   Social History Narrative   Additional History:  Lives with husband and he has been very attentive to her. She stated that she spends time in a very organized fashion.   Assessment:   Musculoskeletal: Strength & Muscle Tone: within normal limits Gait & Station: normal Patient leans: N/A  Psychiatric Specialty Exam: HPI   Review of Systems  Cardiovascular: Positive for claudication.  Psychiatric/Behavioral: The patient is nervous/anxious. The patient does not have insomnia.   All other systems reviewed and are negative.   Blood pressure 132/80, pulse 67, temperature 97.2 F (36.2 C), temperature source Tympanic, height 5\' 2"  (1.575 m), weight 161 lb 12.8 oz (73.392 kg), SpO2 95 %.Body mass index is 29.59 kg/(m^2).  General Appearance: Casual  Eye Contact:  Fair  Speech:  Clear and Coherent  Volume:  Normal  Mood:  Anxious  Affect:  Congruent  Thought Process:  Coherent  Orientation:  Full (Time, Place, and Person)  Thought Content:  WDL  Suicidal Thoughts:  No  Homicidal Thoughts:  No  Memory:  Immediate;   Fair  Judgement:  Fair  Insight:  Fair  Psychomotor Activity:  Normal  Concentration:  Fair  Recall:  [employer] of Knowledge: Fair  Language: Fair  Akathisia:  No  Handed:  Right  AIMS (if indicated):  None   Assets:  Communication Skills Desire for Improvement Social Support  ADL's:  Intact  Cognition: WNL  Sleep:     Is the patient at risk to self?  No. Has the patient been a risk to self in the past 6 months?  No. Has the patient been a risk to self within the distant past?  No. Is the patient a risk to others?  No. Has the patient been a risk to others in the past 6 months?  No. Has the patient been a risk to others within the distant past?  No.  Current  Medications: Current Outpatient Prescriptions  Medication Sig Dispense Refill  . fluticasone (FLONASE) 50 MCG/ACT nasal spray Place into the nose.    . hydrochlorothiazide (HYDRODIURIL) 25 MG tablet TAKE 1 TABLET EVERY DAY    . latanoprost (XALATAN) 0.005 % ophthalmic solution Apply to eye.    . levothyroxine (SYNTHROID) 100 MCG tablet TAKE 1 TABLET EVERY DAY    . losartan (COZAAR) 50 MG tablet TAKE 1 TABLET EVERY DAY    . RABEprazole (ACIPHEX) 20 MG tablet TAKE 1 TABLET EVERY DAY USUALLY 30 MINUTES BEFORE BREAKFAST    . simvastatin (ZOCOR) 20 MG tablet TAKE 1 TABLET EVERY DAY USUALLY IN THE EVENING    . timolol (TIMOPTIC) 0.5 % ophthalmic solution     . venlafaxine XR (EFFEXOR-XR) 150 MG 24 hr capsule Take 1 capsule (150 mg total) by mouth daily with breakfast. 30 capsule 2   No current facility-administered medications for this visit.    Medical Decision Making:  Established Problem, Stable/Improving (1) and Review of Psycho-Social Stressors (1)  Treatment Plan Summary:Medication management   Depression  Patient will continue on Effexor XR 150 mg by mouth daily. Medication refill for the next 2 months and she will follow-up in 2 months or earlier depending on her symptoms.   More than 50% of the time spent in psychoeducation, counseling and coordination of care. Time spent with the patient 25 minutes    This note was generated in part or whole with voice recognition software. Voice regonition is usually quite accurate but there are transcription errors that can and very often do occur. I apologize for any typographical errors that were not detected and corrected.      Rainey Pines 12/04/2014, 11:39 AM

## 2014-12-09 ENCOUNTER — Ambulatory Visit: Payer: Self-pay | Admitting: Licensed Clinical Social Worker

## 2014-12-15 ENCOUNTER — Ambulatory Visit (INDEPENDENT_AMBULATORY_CARE_PROVIDER_SITE_OTHER): Payer: 59 | Admitting: Licensed Clinical Social Worker

## 2014-12-15 DIAGNOSIS — F411 Generalized anxiety disorder: Secondary | ICD-10-CM

## 2014-12-15 DIAGNOSIS — F331 Major depressive disorder, recurrent, moderate: Secondary | ICD-10-CM
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
## 2015-01-29 ENCOUNTER — Ambulatory Visit (INDEPENDENT_AMBULATORY_CARE_PROVIDER_SITE_OTHER): Payer: 59 | Admitting: Psychiatry

## 2015-01-29 ENCOUNTER — Encounter: Payer: Self-pay | Admitting: Psychiatry

## 2015-01-29 VITALS — BP 138/88 | HR 98 | Temp 98.6°F | Ht 62.0 in | Wt 166.2 lb

## 2015-01-29 DIAGNOSIS — F331 Major depressive disorder, recurrent, moderate: Secondary | ICD-10-CM

## 2015-01-29 DIAGNOSIS — C6930 Malignant neoplasm of unspecified choroid: Secondary | ICD-10-CM | POA: Insufficient documentation

## 2015-01-29 MED ORDER — VENLAFAXINE HCL ER 150 MG PO CP24: 150.0000 mg | ORAL_CAPSULE | Freq: Every day | ORAL | Status: AC
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
## 2015-02-04 ENCOUNTER — Other Ambulatory Visit: Payer: Self-pay | Admitting: Internal Medicine

## 2015-02-04 DIAGNOSIS — R0789 Other chest pain: Secondary | ICD-10-CM

## 2015-02-04 DIAGNOSIS — R1013 Epigastric pain: Secondary | ICD-10-CM

## 2015-02-10 ENCOUNTER — Other Ambulatory Visit: Payer: Self-pay | Admitting: Internal Medicine

## 2015-02-11 ENCOUNTER — Other Ambulatory Visit: Payer: Self-pay | Admitting: Internal Medicine

## 2015-02-11 DIAGNOSIS — Z1231 Encounter for screening mammogram for malignant neoplasm of breast: Secondary | ICD-10-CM

## 2015-02-13 ENCOUNTER — Other Ambulatory Visit: Payer: Self-pay

## 2015-02-18 ENCOUNTER — Ambulatory Visit
Admission: RE | Admit: 2015-02-18 | Discharge: 2015-02-18 | Disposition: A | Discharge status: Home / Self Care | Payer: Medicare Other | Source: Ambulatory Visit | Attending: Internal Medicine | Admitting: Internal Medicine

## 2015-02-18 DIAGNOSIS — N281 Cyst of kidney, acquired: Secondary | ICD-10-CM | POA: Insufficient documentation

## 2015-02-18 DIAGNOSIS — R1013 Epigastric pain: Secondary | ICD-10-CM | POA: Diagnosis present

## 2015-02-18 DIAGNOSIS — I2510 Atherosclerotic heart disease of native coronary artery without angina pectoris: Secondary | ICD-10-CM | POA: Insufficient documentation

## 2015-02-18 DIAGNOSIS — R0789 Other chest pain: Secondary | ICD-10-CM | POA: Insufficient documentation

## 2015-02-18 DIAGNOSIS — K449 Diaphragmatic hernia without obstruction or gangrene: Secondary | ICD-10-CM | POA: Diagnosis not present

## 2015-02-23 DIAGNOSIS — R0789 Other chest pain: Secondary | ICD-10-CM | POA: Insufficient documentation

## 2015-03-06 ENCOUNTER — Ambulatory Visit
Admission: RE | Admit: 2015-03-06 | Discharge: 2015-03-06 | Disposition: A | Discharge status: Home / Self Care | Payer: Medicare Other | Source: Ambulatory Visit | Attending: Internal Medicine | Admitting: Internal Medicine

## 2015-03-06 DIAGNOSIS — Z1231 Encounter for screening mammogram for malignant neoplasm of breast: Secondary | ICD-10-CM | POA: Insufficient documentation

## 2015-03-24 DIAGNOSIS — F3341 Major depressive disorder, recurrent, in partial remission: Secondary | ICD-10-CM | POA: Insufficient documentation

## 2015-04-27 ENCOUNTER — Ambulatory Visit (INDEPENDENT_AMBULATORY_CARE_PROVIDER_SITE_OTHER): Payer: 59 | Admitting: Psychiatry

## 2015-04-27 ENCOUNTER — Encounter: Payer: Self-pay | Admitting: Psychiatry

## 2015-04-27 VITALS — BP 122/68 | HR 73 | Temp 98.0°F | Ht 62.0 in | Wt 167.6 lb

## 2015-04-27 DIAGNOSIS — F330 Major depressive disorder, recurrent, mild: Secondary | ICD-10-CM | POA: Diagnosis not present

## 2015-04-27 NOTE — Progress Notes (Signed)
BH MD/PA/NP OP Progress Note  04/27/2015 2:44 PM Sara Vaughn  MRN:  [id number]  Subjective:   Patient is a 82 year old married  female who presented for the follow-up appointment. She reported that she is concerned as they are making decisions about moving their house. Patient reported that her children are pressing on them as they want the appearance to move to a smaller house. She feels that they are getting old and her husband is also losing his memory. She reported that he has been following with a psychiatrist in [place]. Patient reported that she feels stable on her current medications. She is sleeping well at night. She feels concerned about her husband's health and was discussing that in detail. However she starts feeling anxious when she is thinking about relocating to a newer house as she has been currently living at the same place for the past 40+ years. Patient reported that her daughters live in [place] and [place] and her son lives in [place]. She was talking about her family in detail. She appears calm during the interview and denied having any more swings anger anxiety or paranoia. She reported that the medications have been helping her.  She appeared to have a small tremor in her mouth which is normal for her.    Chief Complaint:  Chief Complaint    Follow-up; Medication Refill; Anxiety     Visit Diagnosis:     ICD-9-CM ICD-10-CM   1. MDD (major depressive disorder), recurrent episode, mild ([facility]) 296.31 F33.0     Past Medical History:  Past Medical History  Diagnosis Date  . Anxiety   . Thyroid disease   . Depression   . Hypertension   . Hyperlipidemia     Past Surgical History  Procedure Laterality Date  . Vaginal hysterectomy    . Tonsillectomy     Family History:  Family History  Problem Relation Age of Onset  . Alcohol abuse Father   . Anxiety disorder Father   . Hypertension Mother   . Hyperlipidemia Mother   . Heart attack Mother   .  Transient ischemic attack Mother   . Hypertension Brother   . Diabetes Brother    Social History:  Social History   Social History  . Marital Status: Married    Spouse Name: N/A  . Number of Children: N/A  . Years of Education: N/A   Social History Main Topics  . Smoking status: Former Smoker    Types: Cigarettes    Quit date: 10/13/1974  . Smokeless tobacco: Never Used  . Alcohol Use: 1.2 - 2.4 oz/week    0 Standard drinks or equivalent, 1-2 Glasses of wine, 0 Shots of liquor, 1-2 Cans of beer per week  . Drug Use: No  . Sexual Activity: No   Other Topics Concern  . None   Social History Narrative   Additional History:  Lives with husband and he has been very attentive to her. She stated that she spends time in a very organized fashion.   Assessment:   Musculoskeletal: Strength & Muscle Tone: within normal limits Gait & Station: normal Patient leans: N/A  Psychiatric Specialty Exam: Anxiety Symptoms include nervous/anxious behavior. Patient reports no insomnia.      Review of Systems  Cardiovascular: Positive for claudication.  Psychiatric/Behavioral: The patient is nervous/anxious. The patient does not have insomnia.   All other systems reviewed and are negative.   Blood pressure 122/68, pulse 73, temperature 98 F (36.7 C), temperature source  Tympanic, height 5\' 2"  (1.575 m), weight 167 lb 9.6 oz (76.023 kg), SpO2 91 %.Body mass index is 30.65 kg/(m^2).  General Appearance: Casual  Eye Contact:  Fair  Speech:  Clear and Coherent  Volume:  Normal  Mood:  Anxious  Affect:  Congruent  Thought Process:  Coherent  Orientation:  Full (Time, Place, and Person)  Thought Content:  WDL  Suicidal Thoughts:  No  Homicidal Thoughts:  No  Memory:  Immediate;   Fair  Judgement:  Fair  Insight:  Fair  Psychomotor Activity:  Normal  Concentration:  Fair  Recall:  [employer] of Knowledge: Fair  Language: Fair  Akathisia:  No  Handed:  Right  AIMS (if  indicated):  None   Assets:  Communication Skills Desire for Improvement Social Support  ADL's:  Intact  Cognition: WNL  Sleep:     Is the patient at risk to self?  No. Has the patient been a risk to self in the past 6 months?  No. Has the patient been a risk to self within the distant past?  No. Is the patient a risk to others?  No. Has the patient been a risk to others in the past 6 months?  No. Has the patient been a risk to others within the distant past?  No.  Current Medications: Current Outpatient Prescriptions  Medication Sig Dispense Refill  . fluticasone (FLONASE) 50 MCG/ACT nasal spray Place into the nose.    . hydrochlorothiazide (HYDRODIURIL) 25 MG tablet TAKE 1 TABLET EVERY DAY    . latanoprost (XALATAN) 0.005 % ophthalmic solution Apply to eye.    . levothyroxine (SYNTHROID) 100 MCG tablet TAKE 1 TABLET EVERY DAY    . losartan (COZAAR) 50 MG tablet TAKE 1 TABLET EVERY DAY    . RABEprazole (ACIPHEX) 20 MG tablet TAKE 1 TABLET EVERY DAY USUALLY 30 MINUTES BEFORE BREAKFAST    . simvastatin (ZOCOR) 20 MG tablet TAKE 1 TABLET EVERY DAY USUALLY IN THE EVENING    . timolol (TIMOPTIC) 0.5 % ophthalmic solution     . venlafaxine XR (EFFEXOR-XR) 150 MG 24 hr capsule Take 1 capsule (150 mg total) by mouth daily with breakfast. 90 capsule 2   No current facility-administered medications for this visit.    Medical Decision Making:  Established Problem, Stable/Improving (1) and Review of Psycho-Social Stressors (1)  Treatment Plan Summary:Medication management   Depression  Patient will continue on Effexor XR 150 mg by mouth daily.  She has a refill available on her medications   More than 50% of the time spent in psychoeducation, counseling and coordination of care. Time spent with the patient 25 minutes    This note was generated in part or whole with voice recognition software. Voice regonition is usually quite accurate but there are transcription errors that can and  very often do occur. I apologize for any typographical errors that were not detected and corrected.     Rainey Pines, MD  04/27/2015, 2:44 PM

## 2015-07-23 ENCOUNTER — Ambulatory Visit (INDEPENDENT_AMBULATORY_CARE_PROVIDER_SITE_OTHER): Payer: Self-pay | Admitting: Psychiatry

## 2015-12-07 ENCOUNTER — Other Ambulatory Visit: Payer: Self-pay | Admitting: Psychiatry

## 2016-01-13 ENCOUNTER — Ambulatory Visit: Payer: Medicare Other | Attending: Specialist

## 2016-01-13 DIAGNOSIS — G4710 Hypersomnia, unspecified: Secondary | ICD-10-CM | POA: Insufficient documentation

## 2016-01-13 DIAGNOSIS — G4733 Obstructive sleep apnea (adult) (pediatric): Secondary | ICD-10-CM | POA: Insufficient documentation

## 2016-01-20 ENCOUNTER — Other Ambulatory Visit: Payer: Self-pay | Admitting: Internal Medicine

## 2016-01-20 DIAGNOSIS — R911 Solitary pulmonary nodule: Secondary | ICD-10-CM

## 2016-02-01 ENCOUNTER — Ambulatory Visit
Admission: RE | Admit: 2016-02-01 | Discharge: 2016-02-01 | Disposition: A | Discharge status: Home / Self Care | Payer: Medicare Other | Source: Ambulatory Visit | Attending: Internal Medicine | Admitting: Internal Medicine

## 2016-02-01 DIAGNOSIS — R911 Solitary pulmonary nodule: Secondary | ICD-10-CM | POA: Diagnosis not present

## 2016-02-05 ENCOUNTER — Other Ambulatory Visit: Payer: Self-pay | Admitting: Internal Medicine

## 2016-02-05 DIAGNOSIS — Z1231 Encounter for screening mammogram for malignant neoplasm of breast: Secondary | ICD-10-CM

## 2016-03-07 ENCOUNTER — Ambulatory Visit
Admission: RE | Admit: 2016-03-07 | Discharge: 2016-03-07 | Disposition: A | Discharge status: Home / Self Care | Payer: Medicare Other | Source: Ambulatory Visit | Attending: Internal Medicine | Admitting: Internal Medicine

## 2016-03-07 DIAGNOSIS — Z1231 Encounter for screening mammogram for malignant neoplasm of breast: Secondary | ICD-10-CM | POA: Insufficient documentation

## 2016-03-18 ENCOUNTER — Ambulatory Visit: Payer: Medicare Other

## 2016-06-14 ENCOUNTER — Other Ambulatory Visit: Payer: Self-pay | Admitting: Internal Medicine

## 2016-06-14 DIAGNOSIS — R1084 Generalized abdominal pain: Secondary | ICD-10-CM

## 2016-06-22 ENCOUNTER — Ambulatory Visit
Admission: RE | Admit: 2016-06-22 | Discharge: 2016-06-22 | Disposition: A | Discharge status: Home / Self Care | Payer: Medicare Other | Source: Ambulatory Visit | Attending: Internal Medicine | Admitting: Internal Medicine

## 2016-06-22 DIAGNOSIS — R1084 Generalized abdominal pain: Secondary | ICD-10-CM | POA: Insufficient documentation

## 2016-12-14 ENCOUNTER — Other Ambulatory Visit: Payer: Self-pay | Admitting: Physician Assistant

## 2016-12-14 ENCOUNTER — Ambulatory Visit
Admission: RE | Admit: 2016-12-14 | Discharge: 2016-12-14 | Disposition: A | Discharge status: Home / Self Care | Payer: Medicare Other | Source: Ambulatory Visit | Attending: Physician Assistant | Admitting: Physician Assistant

## 2016-12-14 DIAGNOSIS — M7989 Other specified soft tissue disorders: Secondary | ICD-10-CM | POA: Insufficient documentation

## 2017-06-07 ENCOUNTER — Other Ambulatory Visit: Payer: Self-pay | Admitting: Internal Medicine

## 2017-06-07 DIAGNOSIS — R1011 Right upper quadrant pain: Secondary | ICD-10-CM

## 2017-06-07 DIAGNOSIS — R4182 Altered mental status, unspecified: Secondary | ICD-10-CM

## 2017-07-25 ENCOUNTER — Other Ambulatory Visit: Payer: Self-pay | Admitting: Neurology

## 2017-07-25 ENCOUNTER — Other Ambulatory Visit (HOSPITAL_COMMUNITY): Payer: Self-pay | Admitting: Neurology

## 2017-07-25 DIAGNOSIS — R4189 Other symptoms and signs involving cognitive functions and awareness: Secondary | ICD-10-CM

## 2017-07-27 ENCOUNTER — Ambulatory Visit (HOSPITAL_COMMUNITY)
Admission: RE | Admit: 2017-07-27 | Discharge: 2017-07-27 | Disposition: A | Discharge status: Home / Self Care | Payer: Medicare Other | Source: Ambulatory Visit | Attending: Neurology | Admitting: Neurology

## 2017-07-27 DIAGNOSIS — R4189 Other symptoms and signs involving cognitive functions and awareness: Secondary | ICD-10-CM | POA: Diagnosis present

## 2021-10-19 DEATH — deceased
